# Patient Record
Sex: Female | Born: 1990 | Race: Black or African American | Hispanic: No | Marital: Single | Smoking: Former smoker
Health system: Southern US, Community
[De-identification: ages and names within clinical notes are randomized; demographics above are authoritative.]

## PROBLEM LIST (undated history)

## (undated) DIAGNOSIS — E669 Obesity, unspecified: Secondary | ICD-10-CM

---

## 2012-12-16 ENCOUNTER — Emergency Department (HOSPITAL_BASED_OUTPATIENT_CLINIC_OR_DEPARTMENT_OTHER)
Admission: EM | Admit: 2012-12-16 | Discharge: 2012-12-17 | Disposition: A | Discharge status: Home / Self Care | Payer: Self-pay | Attending: Emergency Medicine | Admitting: Emergency Medicine

## 2012-12-16 ENCOUNTER — Emergency Department (HOSPITAL_BASED_OUTPATIENT_CLINIC_OR_DEPARTMENT_OTHER): Payer: Self-pay

## 2012-12-16 ENCOUNTER — Encounter (HOSPITAL_BASED_OUTPATIENT_CLINIC_OR_DEPARTMENT_OTHER): Payer: Self-pay | Admitting: *Deleted

## 2012-12-16 DIAGNOSIS — S93409A Sprain of unspecified ligament of unspecified ankle, initial encounter: Secondary | ICD-10-CM | POA: Insufficient documentation

## 2012-12-16 DIAGNOSIS — S93402A Sprain of unspecified ligament of left ankle, initial encounter: Secondary | ICD-10-CM

## 2012-12-16 DIAGNOSIS — Y929 Unspecified place or not applicable: Secondary | ICD-10-CM | POA: Insufficient documentation

## 2012-12-16 DIAGNOSIS — Y9351 Activity, roller skating (inline) and skateboarding: Secondary | ICD-10-CM | POA: Insufficient documentation

## 2012-12-16 MED ORDER — OXYCODONE-ACETAMINOPHEN 5-325 MG PO TABS
1.0000 | ORAL_TABLET | Freq: Once | ORAL | Status: AC
  Administered 2012-12-16: 1 via ORAL
  Filled 2012-12-16 (×2): qty 1

## 2012-12-16 MED ORDER — HYDROCODONE-ACETAMINOPHEN 5-325 MG PO TABS: 1.0000 | ORAL_TABLET | Freq: Four times a day (QID) | ORAL | Status: DC | PRN

## 2012-12-16 NOTE — ED Provider Notes (Signed)
History    This chart was scribed for Joanne Dolman Smitty Cords, MD scribed by Joanne Adams. The patient was seen in room MHT13/[facility]13 at  23:37   CSN: [id number]  Arrival date & time 12/16/12  2131   Chief Complaint  Patient presents with  . Ankle Injury    (Consider location/radiation/quality/duration/timing/severity/associated sxs/prior treatment) Patient is a 22 y.o. female presenting with ankle pain. The history is provided by the patient. No language interpreter was used.  Ankle Pain Location:  Ankle Injury: yes   Mechanism of injury: fall   Fall:    Fall occurred:  Recreating/playing (skating)   Impact surface:  Hard floor   Point of impact: ankle.   Entrapped after fall: no   Ankle location:  L ankle Pain details:    Quality:  Aching   Radiates to:  Does not radiate   Severity:  Severe   Onset quality:  Sudden   Timing:  Constant   Progression:  Unchanged Chronicity:  New Dislocation: no   Foreign body present:  No foreign bodies Relieved by:  Nothing Worsened by:  Nothing tried Ineffective treatments:  None tried Risk factors: no concern for non-accidental trauma    Joanne Adams is a 22 y.o. female who presents to the Emergency Department complaining of constant moderate left ankle pain as a result of a injury that occurred when skating tonight. The patient states she fell while skating.   History reviewed. No pertinent past medical history.  Past Surgical History  Procedure Laterality Date  . Cesarean section      History reviewed. No pertinent family history.  History  Substance Use Topics  . Smoking status: Never Smoker   . Smokeless tobacco: Not on file  . Alcohol Use: No    Review of Systems  Musculoskeletal:       Left ankle pain  All other systems reviewed and are negative.    Allergies  Review of patient's allergies indicates no known allergies.  Home Medications  No current outpatient prescriptions on file.  BP 128/79  Pulse 88   Temp(Src) 99.3 F (37.4 C) (Oral)  Resp 20  Ht 5\' 3"  (1.6 m)  Wt 195 lb (88.451 kg)  BMI 34.55 kg/m2  SpO2 100%  Physical Exam  Nursing note and vitals reviewed. Constitutional: She is oriented to person, place, and time. She appears well-developed and well-nourished. No distress.  HENT:  Head: Normocephalic and atraumatic.  Mouth/Throat: Oropharynx is clear and moist.  Eyes: Conjunctivae and EOM are normal. Pupils are equal, round, and reactive to light.  Neck: Normal range of motion. Neck supple. No tracheal deviation present.  Cardiovascular: Normal rate, regular rhythm and normal heart sounds.   Pulmonary/Chest: Effort normal. No respiratory distress. She has no wheezes. She has no rales.  Abdominal: Soft. Bowel sounds are normal. She exhibits no distension.  Musculoskeletal: Normal range of motion. She exhibits tenderness.  3+ DP pulse. Pain over the anterior talar ligament left foot neurovascularly intact   Neurological: She is alert and oriented to person, place, and time. No sensory deficit.  Skin: Skin is warm and dry.  Cap refill < 2 of the toes  Psychiatric: She has a normal mood and affect. Her behavior is normal.    ED Course  Procedures (including critical care time) DIAGNOSTIC STUDIES: Oxygen Saturation is 100% on room air, normal by my interpretation.    COORDINATION OF CARE: 23:38: Physical exam performed and patient informed that there were no fractures on radiology report.  Labs Reviewed - No data to display Dg Ankle Complete Left  12/16/2012  *RADIOLOGY REPORT*  Clinical Data: Ankle injury and pain.  LEFT ANKLE COMPLETE - 3+ VIEW  Comparison: None  Findings: There is no evidence of fracture, subluxation or dislocation. The ankle mortise is intact. The talus is unremarkable. Soft tissue swelling is identified. No focal bony lesions are present.  IMPRESSION: Soft tissue swelling without acute bony abnormality.   Original Report Authenticated By: Harmon Pier,  M.D.    Dg Foot Complete Left  12/16/2012  *RADIOLOGY REPORT*  Clinical Data: Left foot pain following injury.  RIGHT FOOT COMPLETE - 3+ VIEW  Comparison: None  Findings: No evidence of acute fracture, subluxation or dislocation identified.  No radio-opaque foreign bodies are present.  No focal bony lesions are noted.  The joint spaces are unremarkable.  IMPRESSION: No evidence of acute bony abnormality.   Original Report Authenticated By: Harmon Pier, M.D.      No diagnosis found.    MDM  Sprain: ice, elevation, ASO applied and short course of pain medication.  I personally performed the services described in this documentation, which was scribed in my presence. The recorded information has been reviewed and is accurate.         Joanne Awe, MD 12/17/12 [phone]

## 2012-12-16 NOTE — ED Notes (Signed)
Pt states she fell earlier today and injured her left ankle while skating. PMS intact.

## 2014-07-01 ENCOUNTER — Emergency Department (HOSPITAL_BASED_OUTPATIENT_CLINIC_OR_DEPARTMENT_OTHER): Payer: No Typology Code available for payment source

## 2014-07-01 ENCOUNTER — Emergency Department (HOSPITAL_BASED_OUTPATIENT_CLINIC_OR_DEPARTMENT_OTHER)
Admission: EM | Admit: 2014-07-01 | Discharge: 2014-07-02 | Discharge status: Against Medical Advice | Payer: No Typology Code available for payment source | Attending: Emergency Medicine | Admitting: Emergency Medicine

## 2014-07-01 ENCOUNTER — Encounter (HOSPITAL_BASED_OUTPATIENT_CLINIC_OR_DEPARTMENT_OTHER): Payer: Self-pay | Admitting: Emergency Medicine

## 2014-07-01 DIAGNOSIS — S0990XA Unspecified injury of head, initial encounter: Secondary | ICD-10-CM | POA: Insufficient documentation

## 2014-07-01 DIAGNOSIS — Y9289 Other specified places as the place of occurrence of the external cause: Secondary | ICD-10-CM | POA: Insufficient documentation

## 2014-07-01 DIAGNOSIS — S3992XA Unspecified injury of lower back, initial encounter: Secondary | ICD-10-CM | POA: Insufficient documentation

## 2014-07-01 DIAGNOSIS — Y92410 Unspecified street and highway as the place of occurrence of the external cause: Secondary | ICD-10-CM | POA: Insufficient documentation
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
## 2015-10-18 ENCOUNTER — Emergency Department (HOSPITAL_BASED_OUTPATIENT_CLINIC_OR_DEPARTMENT_OTHER): Payer: Medicaid Other

## 2015-10-18 ENCOUNTER — Emergency Department (HOSPITAL_BASED_OUTPATIENT_CLINIC_OR_DEPARTMENT_OTHER)
Admission: EM | Admit: 2015-10-18 | Discharge: 2015-10-18 | Disposition: A | Discharge status: Home / Self Care | Payer: Medicaid Other | Attending: Emergency Medicine | Admitting: Emergency Medicine

## 2015-10-18 ENCOUNTER — Encounter (HOSPITAL_BASED_OUTPATIENT_CLINIC_OR_DEPARTMENT_OTHER): Payer: Self-pay | Admitting: *Deleted

## 2015-10-18 DIAGNOSIS — Z3202 Encounter for pregnancy test, result negative: Secondary | ICD-10-CM | POA: Insufficient documentation

## 2015-10-18 DIAGNOSIS — K8020 Calculus of gallbladder without cholecystitis without obstruction: Secondary | ICD-10-CM | POA: Diagnosis not present

## 2015-10-18 DIAGNOSIS — R1010 Upper abdominal pain, unspecified: Secondary | ICD-10-CM | POA: Diagnosis present

## 2015-10-18 DIAGNOSIS — K8050 Calculus of bile duct without cholangitis or cholecystitis without obstruction: Secondary | ICD-10-CM

## 2015-10-18 LAB — COMPREHENSIVE METABOLIC PANEL
ALBUMIN: 3.7 g/dL (ref 3.5–5.0)
ALT: 13 U/L — ABNORMAL LOW (ref 14–54)
AST: 16 U/L (ref 15–41)
Alkaline Phosphatase: 75 U/L (ref 38–126)
Anion gap: 9 (ref 5–15)
BUN: 7 mg/dL (ref 6–20)
CHLORIDE: 104 mmol/L (ref 101–111)
CO2: 25 mmol/L (ref 22–32)
Calcium: 9.2 mg/dL (ref 8.9–10.3)
Creatinine, Ser: 0.66 mg/dL (ref 0.44–1.00)
GFR calc Af Amer: >60 mL/min (ref >60)
Glucose, Bld: 114 mg/dL — ABNORMAL HIGH (ref 65–99)
POTASSIUM: 3.7 mmol/L (ref 3.5–5.1)
SODIUM: 138 mmol/L (ref 135–145)
Total Bilirubin: 0.3 mg/dL (ref 0.3–1.2)
Total Protein: 7.2 g/dL (ref 6.5–8.1)

## 2015-10-18 LAB — URINALYSIS, ROUTINE W REFLEX MICROSCOPIC
Bilirubin Urine: NEGATIVE
Glucose, UA: NEGATIVE mg/dL
Hgb urine dipstick: NEGATIVE
KETONES UR: NEGATIVE mg/dL
LEUKOCYTES UA: NEGATIVE
NITRITE: NEGATIVE
PH: 5.5 (ref 5.0–8.0)
Protein, ur: NEGATIVE mg/dL
Specific Gravity, Urine: 1.014 (ref 1.005–1.030)

## 2015-10-18 LAB — CBC WITH DIFFERENTIAL/PLATELET
BASOS ABS: 0 10*3/uL (ref 0.0–0.1)
BASOS PCT: 0 %
EOS ABS: 0 10*3/uL (ref 0.0–0.7)
Eosinophils Relative: 0 %
HCT: 36 % (ref 36.0–46.0)
Hemoglobin: 11.8 g/dL — ABNORMAL LOW (ref 12.0–15.0)
LYMPHS ABS: 2.3 10*3/uL (ref 0.7–4.0)
Lymphocytes Relative: 19 %
MCH: 24.8 pg — ABNORMAL LOW (ref 26.0–34.0)
MCHC: 32.8 g/dL (ref 30.0–36.0)
MCV: 75.6 fL — AB (ref 78.0–100.0)
Monocytes Absolute: 0.9 10*3/uL (ref 0.1–1.0)
Monocytes Relative: 7 %
NEUTROS PCT: 73 %
Neutro Abs: 8.8 10*3/uL — ABNORMAL HIGH (ref 1.7–7.7)
PLATELETS: 255 10*3/uL (ref 150–400)
RBC: 4.76 MIL/uL (ref 3.87–5.11)
RDW: 13.9 % (ref 11.5–15.5)
WBC: 12.1 10*3/uL — AB (ref 4.0–10.5)

## 2015-10-18 LAB — PREGNANCY, URINE: PREG TEST UR: NEGATIVE

## 2015-10-18 LAB — LIPASE, BLOOD: LIPASE: 16 U/L (ref 11–51)

## 2015-10-18 MED ORDER — MORPHINE SULFATE (PF) 4 MG/ML IV SOLN
4.0000 mg | Freq: Once | INTRAVENOUS | Status: AC
  Administered 2015-10-18: 4 mg via INTRAVENOUS
  Filled 2015-10-18: qty 1

## 2015-10-18 MED ORDER — GI COCKTAIL ~~LOC~~
30.0000 mL | Freq: Once | ORAL | Status: DC
  Filled 2015-10-18: qty 30

## 2015-10-18 MED ORDER — SODIUM CHLORIDE 0.9 % IV BOLUS (SEPSIS)
500.0000 mL | Freq: Once | INTRAVENOUS | Status: AC
  Administered 2015-10-18: 500 mL via INTRAVENOUS

## 2015-10-18 MED ORDER — ONDANSETRON HCL 4 MG/2ML IJ SOLN
4.0000 mg | Freq: Once | INTRAMUSCULAR | Status: AC
  Administered 2015-10-18: 4 mg via INTRAVENOUS
  Filled 2015-10-18: qty 2

## 2015-10-18 MED ORDER — HYDROMORPHONE HCL 1 MG/ML IJ SOLN
1.0000 mg | Freq: Once | INTRAMUSCULAR | Status: AC
Start: 2015-10-18 — End: 2015-10-18
  Administered 2015-10-18: 1 mg via INTRAVENOUS
  Filled 2015-10-18: qty 1

## 2015-10-18 MED ORDER — HYDROCODONE-ACETAMINOPHEN 5-325 MG PO TABS: 1.0000 | ORAL_TABLET | ORAL | Status: DC | PRN

## 2015-10-18 MED ORDER — ONDANSETRON 4 MG PO TBDP: ORAL_TABLET | ORAL | Status: DC

## 2015-10-18 NOTE — ED Provider Notes (Signed)
CSN: [id number]     Arrival date & time 10/18/15  1358 History  By signing my name below, I, Bethel Born, attest that this documentation has been prepared under the direction and in the presence of Blane Ohara, MD. Electronically Signed: Bethel Born, ED Scribe. 10/18/2015. 4:17 PM   Chief Complaint  Patient presents with  . Abdominal Pain   The history is provided by the patient. No language interpreter was used.   Joanne Adams is a 25 y.o. female with no significant PMHx who presents to the Emergency Department complaining of constant, sharp, upper abdominal pain with onset yesterday. The pain radiates across the abdomen bilaterally and to the chest. Her pain is worse with eating and drinking. Prilosec has provided insufficient relief in pain at home.  Associated symptoms include nausea and non-bloody emesis. Pt denies hematochezia, urinary symptoms, and vaginal discharge. No history of gallbladder or heart disease. Denies alcohol use and risk of pregnancy.   History reviewed. No pertinent past medical history. Past Surgical History  Procedure Laterality Date  . Cesarean section     No family history on file. Social History  Substance Use Topics  . Smoking status: Never Smoker   . Smokeless tobacco: None  . Alcohol Use: No   OB History    No data available     Review of Systems  Gastrointestinal: Positive for nausea, vomiting and abdominal pain.  Genitourinary: Negative for dysuria, hematuria, vaginal bleeding, vaginal discharge and difficulty urinating.  All other systems reviewed and are negative.  Allergies  Review of patient's allergies indicates no known allergies.  Home Medications   Prior to Admission medications   Medication Sig Start Date End Date Taking? Authorizing Provider  HYDROcodone-acetaminophen (NORCO) 5-325 MG tablet Take 1 tablet by mouth every 4 (four) hours as needed. 10/18/15   Blane Ohara, MD  ondansetron (ZOFRAN ODT) 4 MG disintegrating  tablet  ODT q4 hours prn nausea/vomit 10/18/15   Blane Ohara, MD   BP 135/94 mmHg  Pulse 72  Temp(Src) 98.9 F (37.2 C) (Oral)  Resp 18  Ht  (1.6 m)  Wt 246 lb (111.585 kg)  BMI 43.59 kg/m2  SpO2 100%  LMP 09/30/2015 (Approximate) Physical Exam  Constitutional: She is oriented to person, place, and time. She appears well-developed and well-nourished.  HENT:  Head: Normocephalic.  Eyes: EOM are normal. No scleral icterus.  Neck: Normal range of motion.  Pulmonary/Chest: Effort normal.  Abdominal: She exhibits no distension. There is tenderness in the right upper quadrant and epigastric area.  Moderate epigastric tenderness Mild RUQ tenderness No peritonitis   Musculoskeletal: Normal range of motion.  Neurological: She is alert and oriented to person, place, and time.  Psychiatric: She has a normal mood and affect.  Nursing note and vitals reviewed.   ED Course  Procedures (including critical care time)  EMERGENCY DEPARTMENT BILIARY ULTRASOUND INTERPRETATION "Study: Limited Abdominal Ultrasound of the gallbladder and common bile duct."  INDICATIONS: RUQ pain Indication: Multiple views of the gallbladder and common bile duct were obtained in real-time with a Multi-frequency probe." PERFORMED BY:  Myself IMAGES ARCHIVED?: Yes FINDINGS: Gallstones present, Gallbladder wall normal in thickness, Sonographic Murphy's sign absent and Common bile duct normal in size LIMITATIONS: Bowel Gas INTERPRETATION: Cholelithiasis  CPT Code [phone] (limited abdominal)    DIAGNOSTIC STUDIES: Oxygen Saturation is 100% on RA,  normal by my interpretation.    COORDINATION OF CARE: 3:50 PM Discussed treatment plan which includes lab work, bedside US, morphine, Zofran and  IVF with pt at bedside and pt agreed to plan.  Labs Review Labs Reviewed  URINALYSIS, ROUTINE W REFLEX MICROSCOPIC (NOT AT [facility]) - Abnormal; Notable for the following:    APPearance CLOUDY (*)    All other  components within normal limits  CBC WITH DIFFERENTIAL/PLATELET - Abnormal; Notable for the following:    WBC 12.1 (*)    Hemoglobin 11.8 (*)    MCV 75.6 (*)    MCH 24.8 (*)    Neutro Abs 8.8 (*)    All other components within normal limits  COMPREHENSIVE METABOLIC PANEL - Abnormal; Notable for the following:    Glucose, Bld 114 (*)    ALT 13 (*)    All other components within normal limits  PREGNANCY, URINE  LIPASE, BLOOD    Imaging Review US Abdomen Complete  10/18/2015  CLINICAL DATA:  Epigastric pain, upper abdominal pain, nausea and vomiting. EXAM: ABDOMEN ULTRASOUND COMPLETE COMPARISON:  None. FINDINGS: Gallbladder: The gallbladder is normally distended and contains multiple mobile gallstones, the largest of which measures 1.2 cm. Gallbladder wall is marginally thickened to 4 mm. Sonographic Murphy's sign was reported as negative. Common bile duct: Diameter: 5.5 mm. Liver: No focal lesion identified. Within normal limits in parenchymal echogenicity. IVC: No abnormality visualized. Pancreas: Not well seen. Spleen: Size and appearance within normal limits. Right Kidney: Length: 11.5 cm. Echogenicity within normal limits. No mass or hydronephrosis visualized. Left Kidney: Length: 11.6 cm. Echogenicity within normal limits. No mass or hydronephrosis visualized. Abdominal aorta: No aneurysm visualized within its visualized portion. Other findings: None. IMPRESSION: Cholelithiasis with thickening of the gallbladder wall, which in the right clinical setting may suggest acute cholecystitis. Otherwise normal appearance of the abdomen sonographically. Electronically Signed   By: Ted Mcalpine M.D.   On: 10/18/2015 18:21   I have personally reviewed and evaluated these images and lab results as part of my medical decision-making.   EKG Interpretation None      MDM   Final diagnoses:  Biliary colic   I personally performed the services described in this documentation, which was  scribed in my presence. The recorded information has been reviewed and is accurate.  Patient presents with upper abdominal pain, bedside ultrasound multiple gallstones no obvious fluid or wall thickening. Pain meds and nausea meds. Patient improved in the ER. Discussed outpatient follow-up with general surgery to plan cholecystectomy. Discussed reasons to return and signs of infection. Formal ultrasound obtained to assist with close outpatient surgery follow-up and management. Patient has cholelithiasis with possible early cholecystitis. Borderline wall diameter 3 mm and 4 mm. On reassessment patient has no pain well-appearing no fever tolerating oral. Discussed with Dr. Derrell Lolling, patient to call office Monday for appointment and further management of gallbladder.    Results and differential diagnosis were discussed with the patient/parent/guardian. Xrays were independently reviewed by myself.  Close follow up outpatient was discussed, comfortable with the plan.   Medications  sodium chloride 0.9 % bolus 500 mL (0 mLs Intravenous Stopped 10/18/15 1737)  morphine 4 MG/ML injection 4 mg (4 mg Intravenous Given 10/18/15 1618)  ondansetron (ZOFRAN) injection 4 mg (4 mg Intravenous Given 10/18/15 1618)  HYDROmorphone (DILAUDID) injection 1 mg (1 mg Intravenous Given 10/18/15 1739)    Filed Vitals:   10/18/15 1409 10/18/15 1621 10/18/15 1737 10/18/15 1849  BP: 135/94 122/83 125/77 106/69  Pulse: 72 75 77 66  Temp: 98.9 F (37.2 C)     TempSrc: Oral     Resp: 18 18 18  18  Height:  (1.6 m)     Weight: 246 lb (111.585 kg)     SpO2: 100% 100% 100% 97%    Final diagnoses:  Biliary colic      Blane Ohara, MD 10/18/15 [phone]

## 2015-10-18 NOTE — Discharge Instructions (Signed)
Take tylenol for pain, For severe pain take norco or vicodin however realize they have the potential for addiction and it can make you sleepy and has tylenol in it.  No operating machinery while taking. Schedule appointment with surgeon to discuss gallbladder removal.  Return to an ER for fevers, persistent vomiting or other concerns.  If you were given medicines take as directed.  If you are on coumadin or contraceptives realize their levels and effectiveness is altered by many different medicines.  If you have any reaction (rash, tongues swelling, other) to the medicines stop taking and see a physician.    If your blood pressure was elevated in the ER make sure you follow up for management with a primary doctor or return for chest pain, shortness of breath or stroke symptoms.  Please follow up as directed and return to the ER or see a physician for new or worsening symptoms.  Thank you. Filed Vitals:   10/18/15 1409 10/18/15 1621  BP: 135/94 122/83  Pulse: 72 75  Temp: 98.9 F (37.2 C)   TempSrc: Oral   Resp: 18 18  Height:  (1.6 m)   Weight: 246 lb (111.585 kg)   SpO2: 100% 100%    Cholelithiasis Cholelithiasis (also called gallstones) is a form of gallbladder disease. The gallbladder is a small organ that helps you digest fats. Symptoms of gallstones are:  Feeling sick to your stomach (nausea).  Throwing up (vomiting).  Belly pain.  Yellowing of the skin (jaundice).  Sudden pain. You may feel the pain for minutes to hours.  Fever.  Pain to the touch. HOME CARE  Only take medicines as told by your doctor.  Eat a low-fat diet until you see your doctor again. Eating fat can result in pain.  Follow up with your doctor as told. Attacks usually happen time after time. Surgery is usually needed for permanent treatment. GET HELP RIGHT AWAY IF:   Your pain gets worse.  Your pain is not helped by medicines.  You have a fever and lasting symptoms for more than 2-3  days.  You have a fever and your symptoms suddenly get worse.  You keep feeling sick to your stomach and throwing up. MAKE SURE YOU:   Understand these instructions.  Will watch your condition.  Will get help right away if you are not doing well or get worse.   This information is not intended to replace advice given to you by your health care provider. Make sure you discuss any questions you have with your health care provider.   Document Released: 03/01/2008 Document Revised: 05/16/2013 Document Reviewed: 03/07/2013 Elsevier Interactive Patient Education [employer]

## 2015-10-18 NOTE — ED Notes (Signed)
Placed on cont POX monitoring with int NBP monitoring 

## 2015-10-18 NOTE — ED Notes (Signed)
Patient transported to Ultrasound 

## 2015-10-18 NOTE — ED Notes (Signed)
MD at bedside. 

## 2015-10-18 NOTE — ED Notes (Signed)
Pt reports yesterday morning she developed mid-upper abd pain; denies fever, diarrhea, genitourinary symptoms; reports n/v.

## 2015-10-18 NOTE — ED Notes (Signed)
Presents with abd pain, since yesterday am. Radiates to rt flank. Has had some vomiting this am, approx 4 episodes.

## 2017-07-31 IMAGING — US US ABDOMEN COMPLETE
1 series · 14 of 25 positions shown · non-contrast
Comparison: None.

CLINICAL DATA: Epigastric pain, upper abdominal pain, nausea and
vomiting.

EXAM:
ABDOMEN ULTRASOUND COMPLETE

[Series 1: us abdomen complete · 0.22mm/px · 14 of 90 slices shown]
[im 1/90]
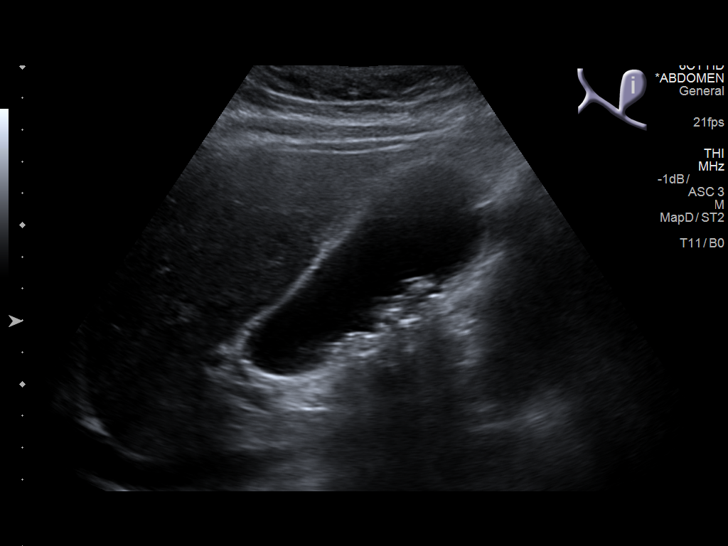
[im 8/90]
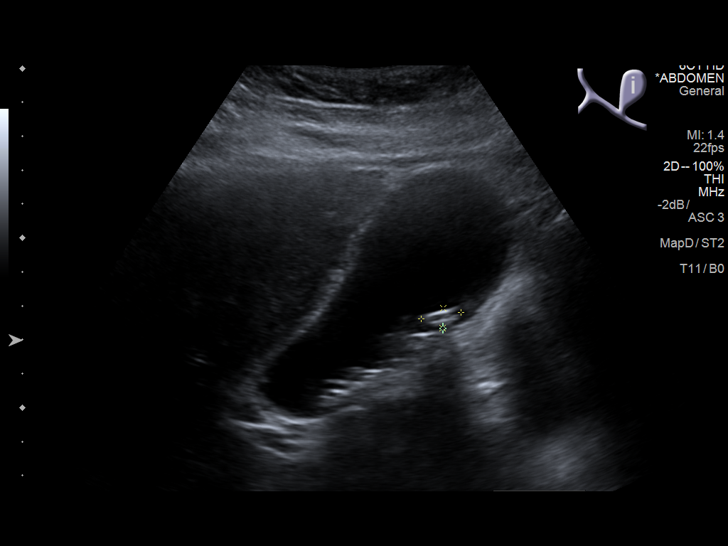
[im 15/90]
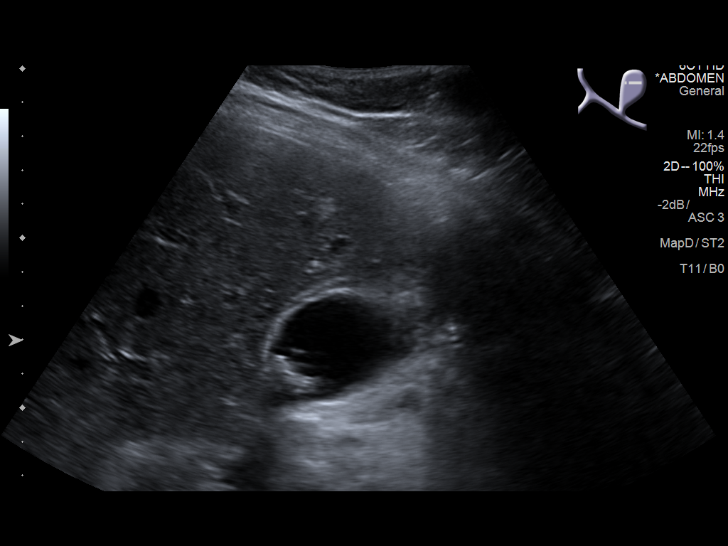
[im 23/90]
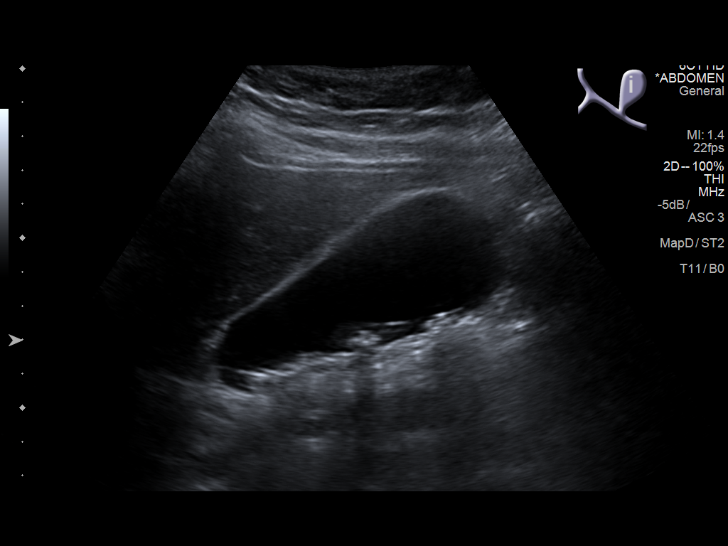
[im 30/90]
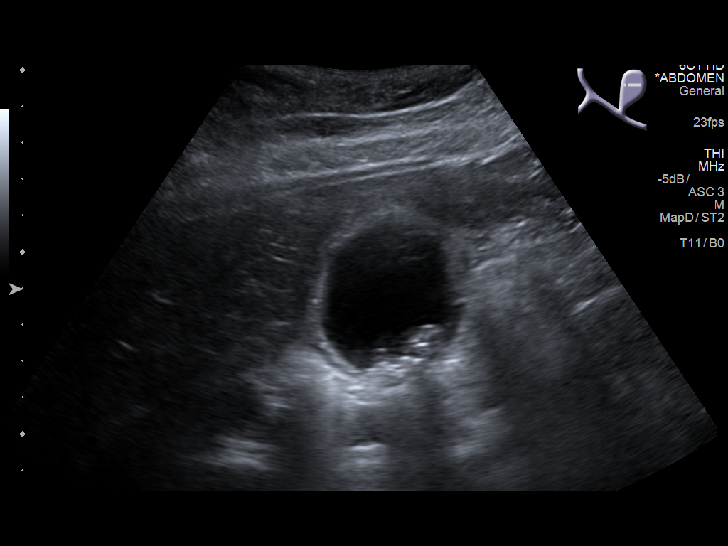
[im 34/90]
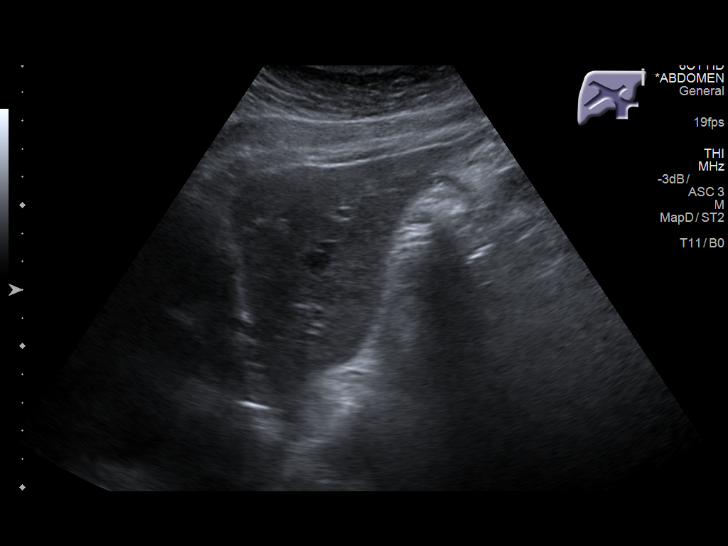
[im 41/90]
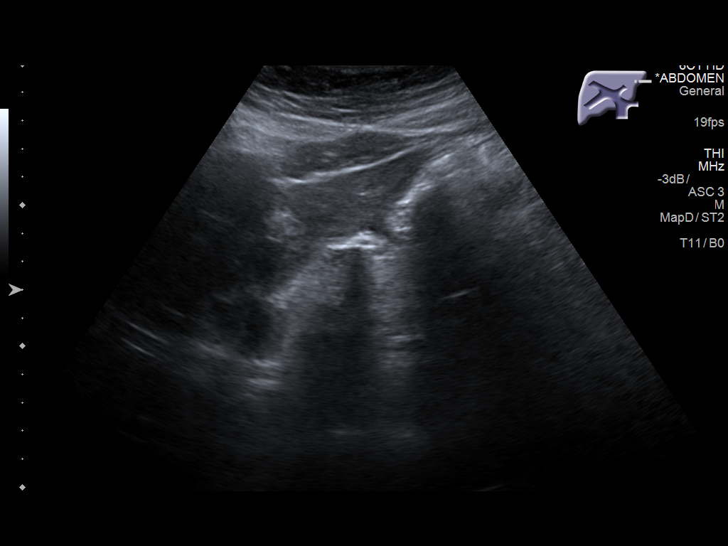
[im 49/90]
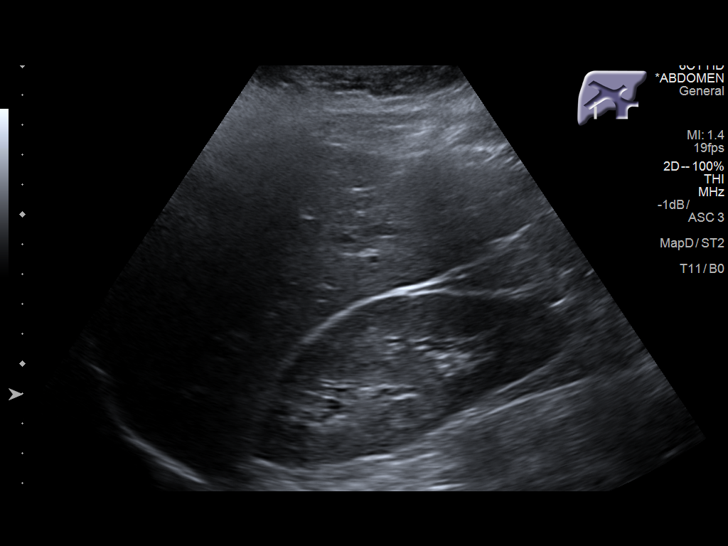
[im 56/90]
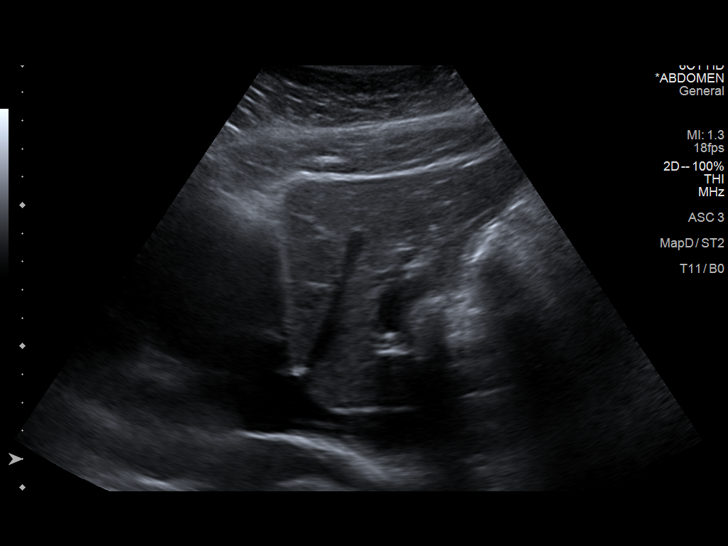
[im 60/90]
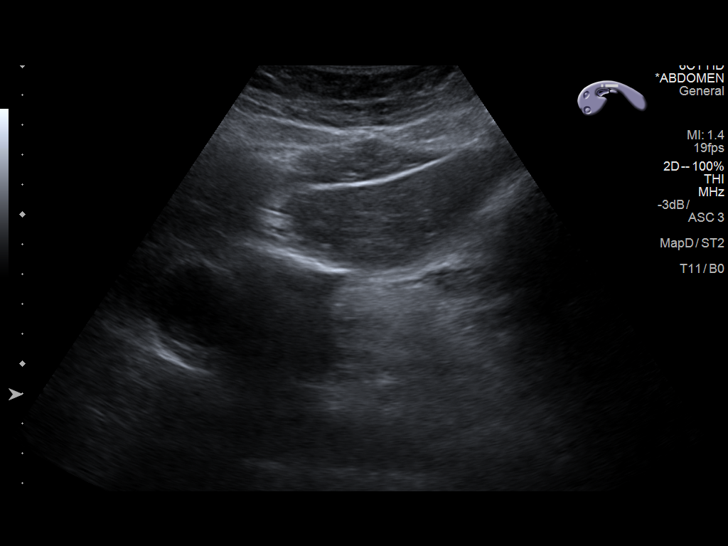
[im 67/90]
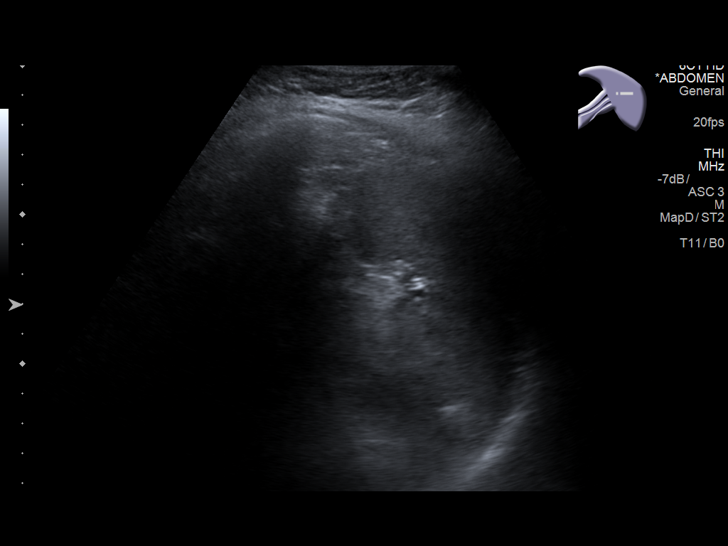
[im 75/90]
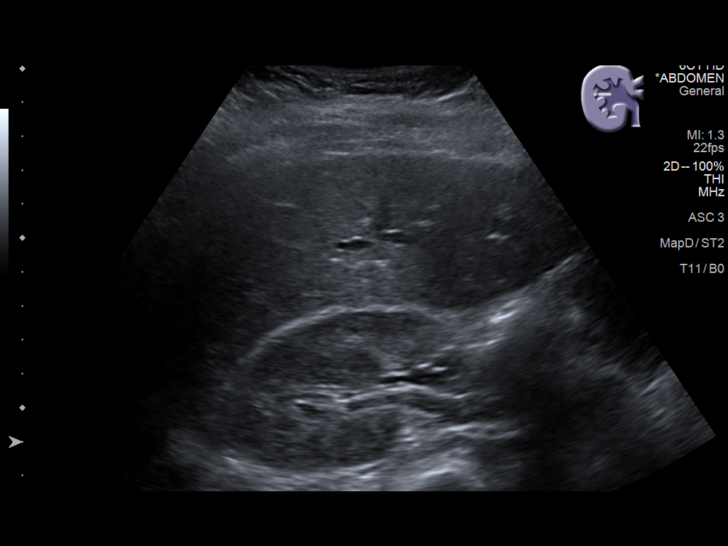
[im 82/90]
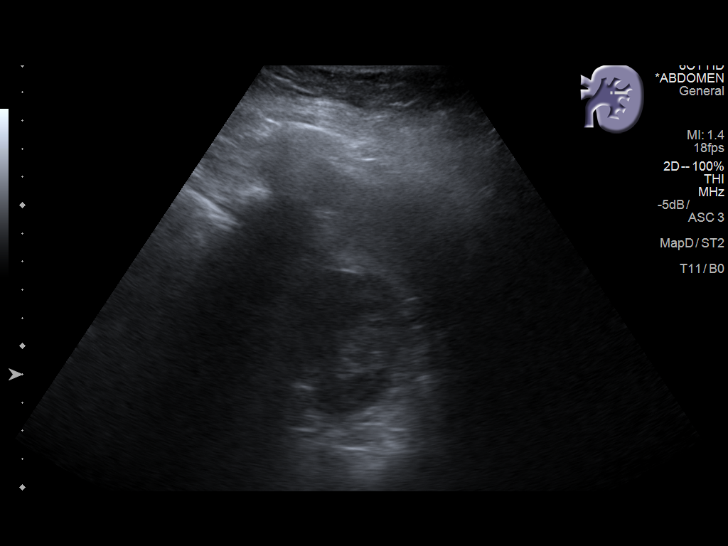
[im 90/90]
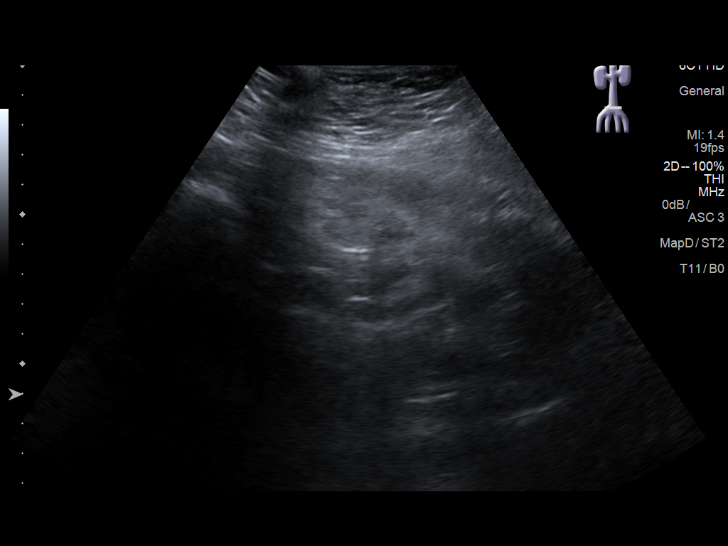

[14 of 25 positions shown; findings below may reference images not displayed]

FINDINGS: Gallbladder: The gallbladder is normally distended and contains
multiple mobile gallstones, the largest of which measures 1.2 cm.
Gallbladder wall is marginally thickened to 4 mm. Sonographic
Murphy's sign was reported as negative.

Common bile duct: Diameter: 5.5 mm.

Liver: No focal lesion identified. Within normal limits in
parenchymal echogenicity.

IVC: No abnormality visualized.

Pancreas: Not well seen.

Spleen: Size and appearance within normal limits.

Right Kidney: Length: 11.5 cm. Echogenicity within normal limits. No
mass or hydronephrosis visualized.

Left Kidney: Length: 11.6 cm. Echogenicity within normal limits. No
mass or hydronephrosis visualized.

Abdominal aorta: No aneurysm visualized within its visualized
portion.

Other findings: None.
IMPRESSION: Cholelithiasis with thickening of the gallbladder wall, which in the
right clinical setting may suggest acute cholecystitis.

Otherwise normal appearance of the abdomen sonographically.

## 2018-06-08 ENCOUNTER — Emergency Department (HOSPITAL_BASED_OUTPATIENT_CLINIC_OR_DEPARTMENT_OTHER): Payer: Self-pay

## 2018-06-08 ENCOUNTER — Encounter (HOSPITAL_BASED_OUTPATIENT_CLINIC_OR_DEPARTMENT_OTHER): Payer: Self-pay

## 2018-06-08 ENCOUNTER — Emergency Department (HOSPITAL_BASED_OUTPATIENT_CLINIC_OR_DEPARTMENT_OTHER)
Admission: EM | Admit: 2018-06-08 | Discharge: 2018-06-08 | Disposition: A | Discharge status: Home / Self Care | Payer: Self-pay | Attending: Emergency Medicine | Admitting: Emergency Medicine

## 2018-06-08 ENCOUNTER — Other Ambulatory Visit: Payer: Self-pay

## 2018-06-08 DIAGNOSIS — F17200 Nicotine dependence, unspecified, uncomplicated: Secondary | ICD-10-CM | POA: Insufficient documentation

## 2018-06-08 DIAGNOSIS — J209 Acute bronchitis, unspecified: Secondary | ICD-10-CM | POA: Insufficient documentation

## 2018-06-08 MED ORDER — ALBUTEROL SULFATE HFA 108 (90 BASE) MCG/ACT IN AERS
INHALATION_SPRAY | RESPIRATORY_TRACT | Status: AC
  Administered 2018-06-08: 2 via RESPIRATORY_TRACT
  Filled 2018-06-08: qty 6.7

## 2018-06-08 MED ORDER — ALBUTEROL SULFATE HFA 108 (90 BASE) MCG/ACT IN AERS: 2.0000 | INHALATION_SPRAY | Freq: Once | RESPIRATORY_TRACT | Status: AC

## 2018-06-08 MED ORDER — ALBUTEROL SULFATE (2.5 MG/3ML) 0.083% IN NEBU
5.0000 mg | INHALATION_SOLUTION | Freq: Once | RESPIRATORY_TRACT | Status: AC
  Administered 2018-06-08: 5 mg via RESPIRATORY_TRACT
  Filled 2018-06-08: qty 6

## 2018-06-08 MED ORDER — ACETAMINOPHEN 325 MG PO TABS
650.0000 mg | ORAL_TABLET | Freq: Once | ORAL | Status: AC
  Administered 2018-06-08: 650 mg via ORAL
  Filled 2018-06-08: qty 2

## 2018-06-08 NOTE — ED Triage Notes (Signed)
Pt c/o cough, SOB, CP x 2 days-NAD-steady gait

## 2018-06-08 NOTE — ED Provider Notes (Signed)
[facility] EMERGENCY DEPARTMENT Provider Note   CSN: [id number] Arrival date & time: 06/08/18  1155     History   Chief Complaint Chief Complaint  Patient presents with  . Cough    HPI Joanne Adams is a 27 y.o. female.  HPI  27 year old female presents with chest pain, cough, and shortness of breath.  All the symptoms started yesterday and have been continuous since then.  No fevers, rhinorrhea, congestion, or vomiting.  Chest pain seems to worsen with deep inspiration.  She feels short of breath when ambulating.  She is a smoker but denies any other significant past medical history.  Is not on oral contraceptives and has no prior history of DVT/PE.  No recent travel or leg swelling.  No recent surgeries.  Cough is mildly productive today but no blood.  History reviewed. No pertinent past medical history.  There are no active problems to display for this patient.   Past Surgical History:  Procedure Laterality Date  . CESAREAN SECTION       OB History   None      Home Medications    Prior to Admission medications   Medication Sig Start Date End Date Taking? Authorizing Provider  HYDROcodone-acetaminophen (NORCO) 5-325 MG tablet Take 1 tablet by mouth every 4 (four) hours as needed. 10/18/15   Blane Ohara, MD  ondansetron (ZOFRAN ODT) 4 MG disintegrating tablet 4mg  ODT q4 hours prn nausea/vomit 10/18/15   Blane Ohara, MD    Family History No family history on file.  Social History Social History   Tobacco Use  . Smoking status: Current Every Day Smoker  . Smokeless tobacco: Never Used  Substance Use Topics  . Alcohol use: No  . Drug use: No     Allergies   Patient has no known allergies.   Review of Systems Review of Systems  Constitutional: Negative for fever.  Respiratory: Positive for cough and shortness of breath.   Cardiovascular: Positive for chest pain. Negative for leg swelling.  Gastrointestinal: Negative for abdominal pain  and vomiting.  All other systems reviewed and are negative.    Physical Exam Updated Vital Signs BP (!) 125/108 (BP Location: Left Arm)   Pulse 84   Temp 98.8 F (37.1 C) (Oral)   Resp 20   Ht 5\' 4"  (1.626 m)   Wt 92.1 kg   LMP 05/18/2018   SpO2 100%   BMI 34.84 kg/m   Physical Exam  Constitutional: She is oriented to person, place, and time. She appears well-developed and well-nourished. No distress.  HENT:  Head: Normocephalic and atraumatic.  Right Ear: External ear normal.  Left Ear: External ear normal.  Nose: Nose normal.  Eyes: Right eye exhibits no discharge. Left eye exhibits no discharge.  Cardiovascular: Normal rate, regular rhythm and normal heart sounds.  Pulmonary/Chest: Effort normal. She has wheezes (mild, expiratory, diffuse). She exhibits no tenderness.  Abdominal: Soft. There is no tenderness.  Musculoskeletal: She exhibits no edema.  Neurological: She is alert and oriented to person, place, and time.  Skin: Skin is warm and dry. She is not diaphoretic.  Nursing note and vitals reviewed.    ED Treatments / Results  Labs (all labs ordered are listed, but only abnormal results are displayed) Labs Reviewed - No data to display  EKG EKG Interpretation  Date/Time:  Thursday June 08 2018 12:24:08 EDT Ventricular Rate:  76 PR Interval:    QRS Duration: 86 QT Interval:  384 QTC Calculation: 432  R Axis:   76 Text Interpretation:  Sinus rhythm no acute ST/T changes No old tracing to compare Confirmed by Pricilla LovelessGoldston, Samyukta Cura [phone]) on 06/08/2018 12:34:32 PM   Radiology Dg Chest 2 View  Result Date: 06/08/2018 CLINICAL DATA:  Cough, shortness of breath and chest pain. EXAM: CHEST - 2 VIEW COMPARISON:  06/28/2017 FINDINGS: Cardiomediastinal silhouette is normal. Mediastinal contours appear intact. There is no evidence of focal airspace consolidation, pleural effusion or pneumothorax. Osseous structures are without acute abnormality. Soft tissues are  grossly normal. IMPRESSION: No active cardiopulmonary disease. Electronically Signed   By: Ted Mcalpineobrinka  Dimitrova M.D.   On: 06/08/2018 12:32    Procedures Procedures (including critical care time)  Medications Ordered in ED Medications  albuterol (PROVENTIL HFA;VENTOLIN HFA) 108 (90 Base) MCG/ACT inhaler 2 puff (has no administration in time range)  albuterol (PROVENTIL) (2.5 MG/3ML) 0.083% nebulizer solution 5 mg (5 mg Nebulization Given 06/08/18 1227)  acetaminophen (TYLENOL) tablet 650 mg (650 mg Oral Given 06/08/18 1237)     Initial Impression / Assessment and Plan / ED Course  I have reviewed the triage vital signs and the nursing notes.  Pertinent labs & imaging results that were available during my care of the patient were reviewed by me and considered in my medical decision making (see chart for details).     Symptoms are consistent with acute bronchitis.  While she does have some pleuritic chest pain in association with shortness of breath I have very low suspicion for PE.  She is PERC negative and an otherwise low risk presentation.  With the wheezing, mildly productive cough, and smoking history I think bronchitis makes more sense.  She feels better after albuterol treatment while she still has some wheezes, she is overall feeling better and no longer has any symptoms.  I will give her an inhaler and respiratory we will show her how to use it.  She will follow-up with PCP.  She was advised to stop smoking.  Return precautions.  Final Clinical Impressions(s) / ED Diagnoses   Final diagnoses:  Acute bronchitis, unspecified organism    ED Discharge Orders    None       Pricilla LovelessGoldston, Tonya Carlile, MD 06/08/18 1307

## 2018-06-08 NOTE — Discharge Instructions (Signed)
You may use the albuterol inhaler 1-2 puffs every 4 hours as needed for shortness of breath or wheezing.  If you need it more than this, have worsening shortness of breath, chest pain, or develop fevers or any other new/concerning symptoms and return to the ER for evaluation.

## 2019-03-12 ENCOUNTER — Other Ambulatory Visit: Payer: Self-pay

## 2019-03-12 ENCOUNTER — Encounter (HOSPITAL_BASED_OUTPATIENT_CLINIC_OR_DEPARTMENT_OTHER): Payer: Self-pay | Admitting: Emergency Medicine

## 2019-03-12 ENCOUNTER — Emergency Department (HOSPITAL_BASED_OUTPATIENT_CLINIC_OR_DEPARTMENT_OTHER)
Admission: EM | Admit: 2019-03-12 | Discharge: 2019-03-12 | Disposition: A | Discharge status: Home / Self Care | Payer: Self-pay | Attending: Emergency Medicine | Admitting: Emergency Medicine

## 2019-03-12 DIAGNOSIS — Z87891 Personal history of nicotine dependence: Secondary | ICD-10-CM | POA: Insufficient documentation

## 2019-03-12 DIAGNOSIS — N939 Abnormal uterine and vaginal bleeding, unspecified: Secondary | ICD-10-CM | POA: Insufficient documentation

## 2019-03-12 HISTORY — DX: Obesity, unspecified: E66.9

## 2019-03-12 LAB — PREGNANCY, URINE: Preg Test, Ur: NEGATIVE

## 2019-03-12 NOTE — ED Provider Notes (Signed)
[facility] EMERGENCY DEPARTMENT Provider Note   CSN: [id number] Arrival date & time: 03/12/19  [id number]    History   Chief Complaint Chief Complaint  Patient presents with  . Vaginal Bleeding    HPI Joanne Adams is a 28 y.o. female.     The history is provided by the patient.  Female GU Problem This is a new problem. The current episode started more than 1 week ago. The problem occurs daily. The problem has not changed since onset.Pertinent negatives include no chest pain, no abdominal pain and no shortness of breath. Nothing aggravates the symptoms. Nothing relieves the symptoms. She has tried nothing for the symptoms. The treatment provided no relief.    Past Medical History:  Diagnosis Date  . Obesity     There are no active problems to display for this patient.   Past Surgical History:  Procedure Laterality Date  . CESAREAN SECTION       OB History   No obstetric history on file.      Home Medications    Prior to Admission medications   Not on File    Family History No family history on file.  Social History Social History   Tobacco Use  . Smoking status: Former Games developermoker  . Smokeless tobacco: Never Used  Substance Use Topics  . Alcohol use: Yes    Comment: occ  . Drug use: No     Allergies   Patient has no known allergies.   Review of Systems Review of Systems  Respiratory: Negative for shortness of breath.   Cardiovascular: Negative for chest pain.  Gastrointestinal: Negative for abdominal pain, constipation, diarrhea, nausea and vomiting.  Genitourinary: Positive for menstrual problem and vaginal bleeding. Negative for decreased urine volume, difficulty urinating, dysuria, flank pain, frequency, genital sores, hematuria, pelvic pain, urgency, vaginal discharge and vaginal pain.     Physical Exam Updated Vital Signs BP 115/81 (BP Location: Right Arm)   Pulse 69   Temp 98.2 F (36.8 C) (Oral)   Ht 5\' 3"  (1.6 m)   Wt 107  kg   LMP 02/26/2019   SpO2 100%   BMI 41.81 kg/m   Physical Exam Vitals signs and nursing note reviewed.  Constitutional:      General: She is not in acute distress.    Appearance: She is well-developed. She is not ill-appearing.  HENT:     Head: Normocephalic and atraumatic.  Eyes:     Conjunctiva/sclera: Conjunctivae normal.  Neck:     Musculoskeletal: Neck supple.  Cardiovascular:     Heart sounds: No murmur.  Pulmonary:     Effort: Pulmonary effort is normal. No respiratory distress.  Abdominal:     General: There is no distension.     Palpations: Abdomen is soft.     Tenderness: There is no abdominal tenderness.  Genitourinary:    Vagina: No vaginal discharge.     Comments: Scant blood from cervix, no brisk bleeding of cervix/no lesions Skin:    General: Skin is warm and dry.     Capillary Refill: Capillary refill takes less than 2 seconds.  Neurological:     Mental Status: She is alert.      ED Treatments / Results  Labs (all labs ordered are listed, but only abnormal results are displayed) Labs Reviewed  PREGNANCY, URINE    EKG None  Radiology No results found.  Procedures Procedures (including critical care time)  Medications Ordered in ED Medications - No data to  display   Initial Impression / Assessment and Plan / ED Course  I have reviewed the triage vital signs and the nursing notes.  Pertinent labs & imaging results that were available during my care of the patient were reviewed by me and considered in my medical decision making (see chart for details).     Joanne Adams is a 28 year old female who presents to the ED with vaginal bleeding.  Patient with normal vitals.  No fever.  Pregnancy test negative.  Patient has no abdominal pain.  Has had some intermittent vaginal bleeding for the past 2 weeks.  Patient has Nexplanon which she thinks was placed about 3 years ago.  Has not followed up with OB.  Has scant blood in the vaginal vault.   No lesions.  Suspect abnormal uterine bleeding.  Recommend follow-up with primary doctor or OB/GYN for further discussion about birth control.  Recommend Tylenol Motrin for cramps as needed.  Patient does not appear pale.  Normal vitals.  No tachycardia.  No chest pain, lightheadedness, shortness of breath.  Doubt significant anemia.  Discharged from ED in good condition.  This chart was dictated using voice recognition software.  Despite best efforts to proofread,  errors can occur which can change the documentation meaning.    Final Clinical Impressions(s) / ED Diagnoses   Final diagnoses:  Vaginal bleeding    ED Discharge Orders    None       Lennice Sites, DO 03/12/19 1829

## 2019-03-12 NOTE — ED Notes (Signed)
ED Provider at bedside. 

## 2019-03-12 NOTE — ED Triage Notes (Signed)
Vaginal bleeding x2 weeks.  Denies urinary sx.  Denies vaginal discharge, burning or itching.

## 2020-04-03 IMAGING — CR DG CHEST 2V
2 series · 2 of 2 positions shown · non-contrast
Comparison: 06/28/2017

CLINICAL DATA: Cough, shortness of breath and chest pain.

EXAM:
CHEST - 2 VIEW

[w chest pa]
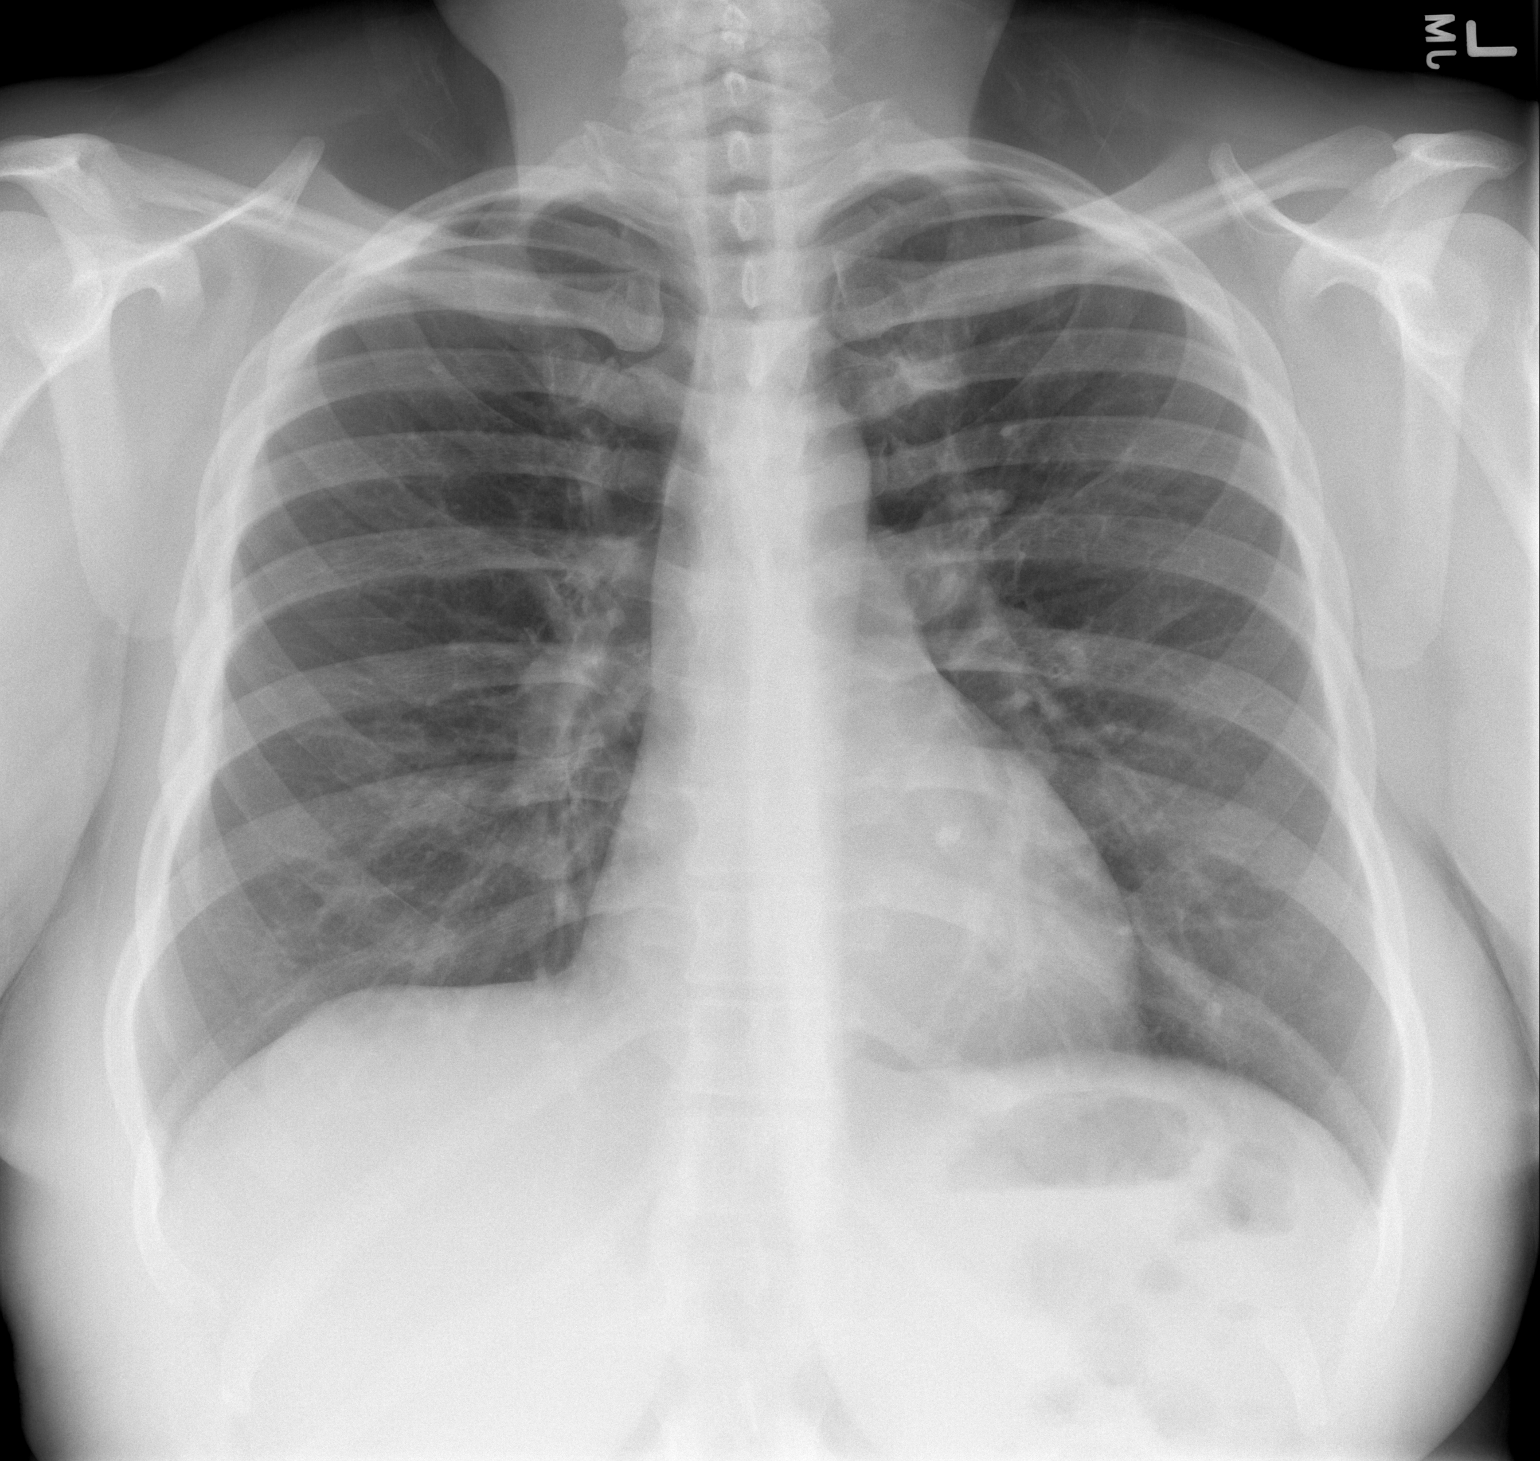

[w chest lat]
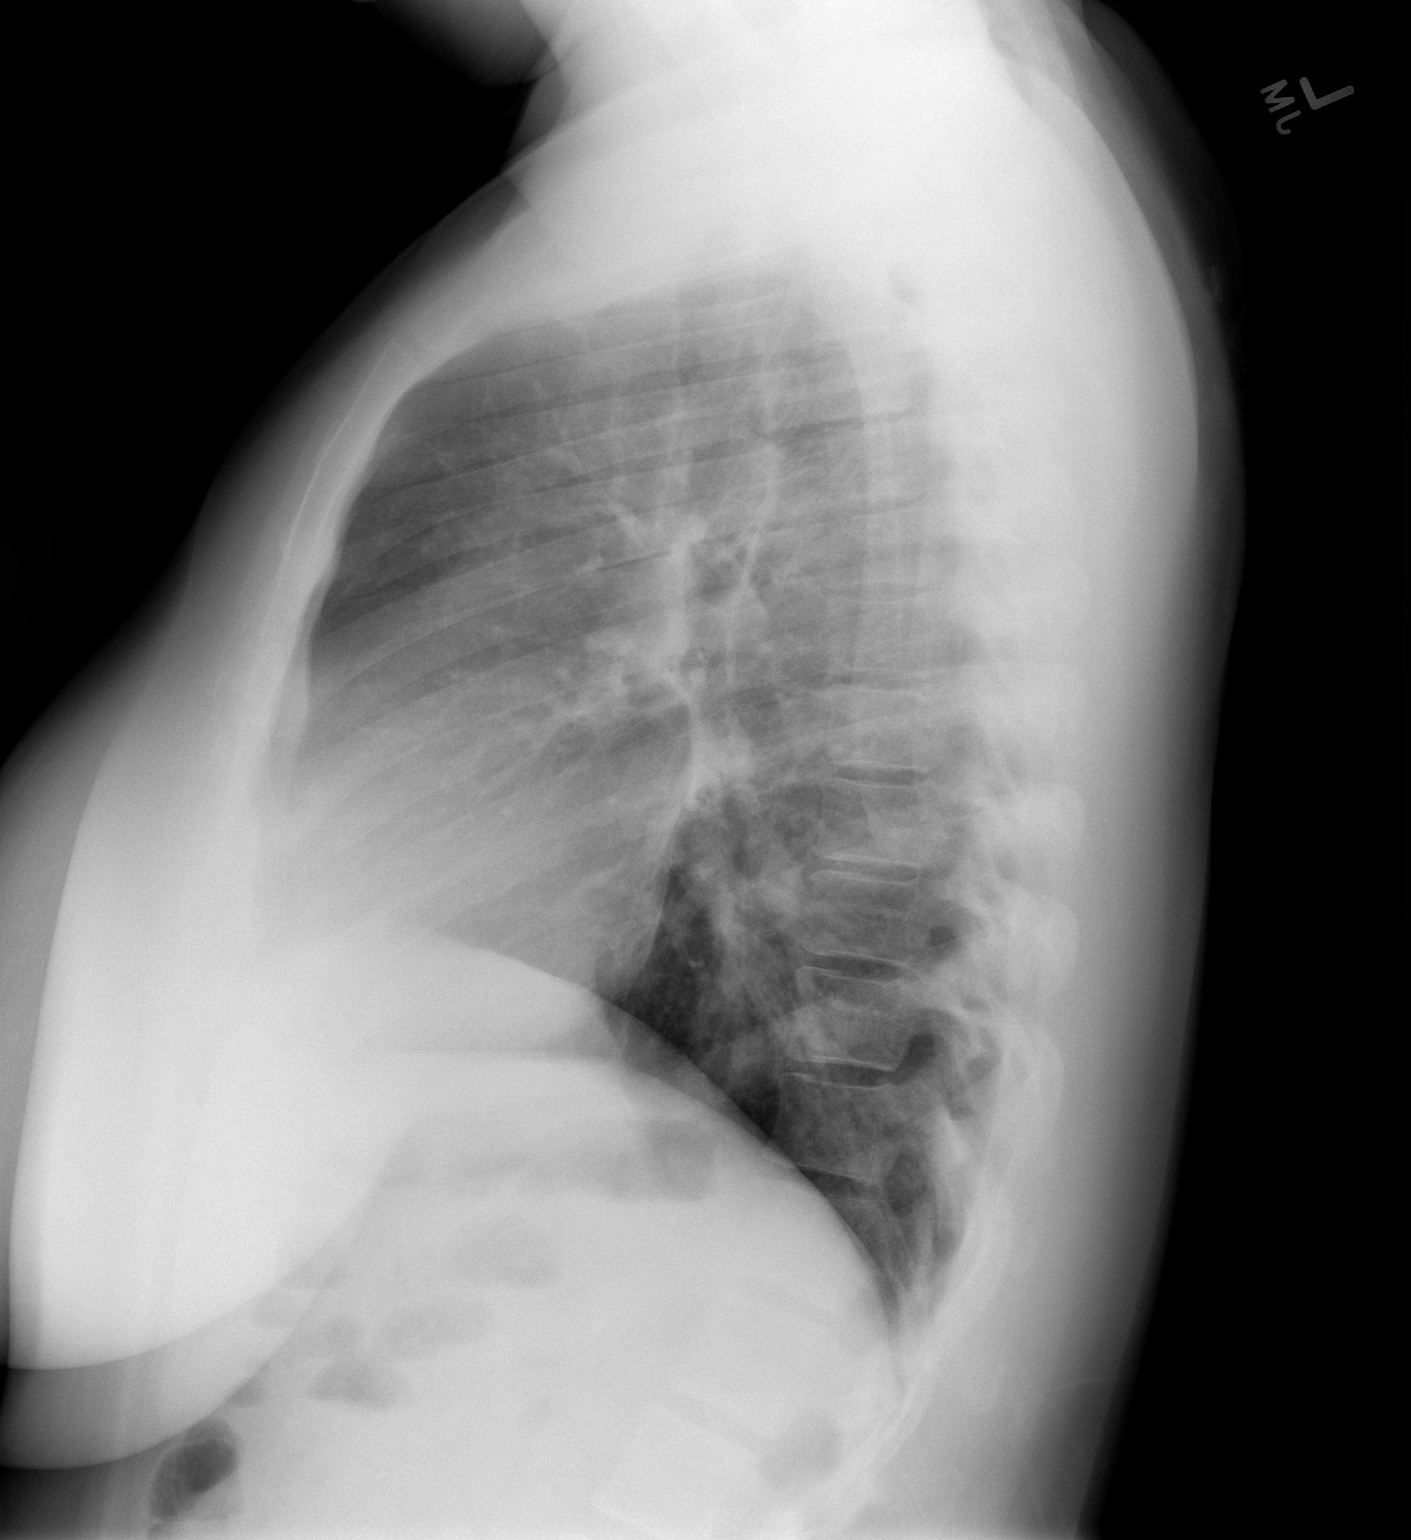

[2 of 2 positions shown; findings below may reference images not displayed]

FINDINGS: Cardiomediastinal silhouette is normal. Mediastinal contours appear
intact.

There is no evidence of focal airspace consolidation, pleural
effusion or pneumothorax.

Osseous structures are without acute abnormality. Soft tissues are
grossly normal.
IMPRESSION: No active cardiopulmonary disease.
